# Patient Record
Sex: Female | Born: 1995 | Race: White | Hispanic: No | Smoking: Current every day smoker
Health system: Southern US, Community
[De-identification: ages and names within clinical notes are randomized; demographics above are authoritative.]

## PROBLEM LIST (undated history)

## (undated) DIAGNOSIS — M419 Scoliosis, unspecified: Secondary | ICD-10-CM

## (undated) DIAGNOSIS — O039 Complete or unspecified spontaneous abortion without complication: Secondary | ICD-10-CM

## (undated) HISTORY — PX: DILATION AND CURETTAGE OF UTERUS: SHX78

---

## 2019-06-06 ENCOUNTER — Other Ambulatory Visit: Payer: Self-pay

## 2019-06-06 ENCOUNTER — Emergency Department (HOSPITAL_COMMUNITY): Payer: No Typology Code available for payment source

## 2019-06-06 ENCOUNTER — Encounter (HOSPITAL_COMMUNITY): Payer: Self-pay | Admitting: Emergency Medicine

## 2019-06-06 ENCOUNTER — Emergency Department (HOSPITAL_COMMUNITY)
Admission: EM | Admit: 2019-06-06 | Discharge: 2019-06-06 | Disposition: A | Discharge status: Home / Self Care | Payer: No Typology Code available for payment source | Attending: Emergency Medicine | Admitting: Emergency Medicine

## 2019-06-06 DIAGNOSIS — S060X1A Concussion with loss of consciousness of 30 minutes or less, initial encounter: Secondary | ICD-10-CM

## 2019-06-06 DIAGNOSIS — S0990XA Unspecified injury of head, initial encounter: Secondary | ICD-10-CM | POA: Diagnosis present

## 2019-06-06 DIAGNOSIS — F1210 Cannabis abuse, uncomplicated: Secondary | ICD-10-CM | POA: Diagnosis not present

## 2019-06-06 DIAGNOSIS — Y998 Other external cause status: Secondary | ICD-10-CM | POA: Insufficient documentation

## 2019-06-06 DIAGNOSIS — R112 Nausea with vomiting, unspecified: Secondary | ICD-10-CM | POA: Diagnosis not present

## 2019-06-06 DIAGNOSIS — Y9389 Activity, other specified: Secondary | ICD-10-CM | POA: Insufficient documentation

## 2019-06-06 DIAGNOSIS — Y92410 Unspecified street and highway as the place of occurrence of the external cause: Secondary | ICD-10-CM | POA: Diagnosis not present

## 2019-06-06 HISTORY — DX: Scoliosis, unspecified: M41.9

## 2019-06-06 HISTORY — DX: Complete or unspecified spontaneous abortion without complication: O03.9

## 2019-06-06 LAB — RAPID URINE DRUG SCREEN, HOSP PERFORMED
Amphetamines: NOT DETECTED
Barbiturates: NOT DETECTED
Benzodiazepines: NOT DETECTED
Cocaine: NOT DETECTED
Opiates: NOT DETECTED
Tetrahydrocannabinol: POSITIVE — AB

## 2019-06-06 LAB — COMPREHENSIVE METABOLIC PANEL
ALT: 15 U/L (ref 0–44)
AST: 26 U/L (ref 15–41)
Albumin: 3.7 g/dL (ref 3.5–5.0)
Alkaline Phosphatase: 40 U/L (ref 38–126)
Anion gap: 10 (ref 5–15)
BUN: 8 mg/dL (ref 6–20)
CO2: 18 mmol/L — ABNORMAL LOW (ref 22–32)
Calcium: 8.5 mg/dL — ABNORMAL LOW (ref 8.9–10.3)
Chloride: 109 mmol/L (ref 98–111)
Creatinine, Ser: 0.71 mg/dL (ref 0.44–1.00)
GFR calc Af Amer: >60 mL/min (ref >60)
GFR calc non Af Amer: >60 mL/min (ref >60)
Glucose, Bld: 103 mg/dL — ABNORMAL HIGH (ref 70–99)
Potassium: 3.8 mmol/L (ref 3.5–5.1)
Sodium: 137 mmol/L (ref 135–145)
Total Bilirubin: 0.6 mg/dL (ref 0.3–1.2)
Total Protein: 5.9 g/dL — ABNORMAL LOW (ref 6.5–8.1)

## 2019-06-06 LAB — LACTIC ACID, PLASMA
Lactic Acid, Venous: 3.7 mmol/L (ref 0.5–1.9)
Lactic Acid, Venous: 1.5 mmol/L (ref 0.5–1.9)

## 2019-06-06 LAB — I-STAT CHEM 8, ED
BUN: 8 mg/dL (ref 6–20)
Calcium, Ion: 1.17 mmol/L (ref 1.15–1.40)
Chloride: 106 mmol/L (ref 98–111)
Creatinine, Ser: 0.5 mg/dL (ref 0.44–1.00)
Glucose, Bld: 99 mg/dL (ref 70–99)
HCT: 40 % (ref 36.0–46.0)
Hemoglobin: 13.6 g/dL (ref 12.0–15.0)
Potassium: 3.8 mmol/L (ref 3.5–5.1)
Sodium: 137 mmol/L (ref 135–145)
TCO2: 21 mmol/L — ABNORMAL LOW (ref 22–32)

## 2019-06-06 LAB — URINALYSIS, ROUTINE W REFLEX MICROSCOPIC
Bilirubin Urine: NEGATIVE
Glucose, UA: NEGATIVE mg/dL
Hgb urine dipstick: NEGATIVE
Ketones, ur: 5 mg/dL — AB
Leukocytes,Ua: NEGATIVE
Nitrite: NEGATIVE
Protein, ur: 30 mg/dL — AB
Specific Gravity, Urine: 1.017 (ref 1.005–1.030)
pH: 5 (ref 5.0–8.0)

## 2019-06-06 LAB — PROTIME-INR
INR: 1.2 (ref 0.8–1.2)
Prothrombin Time: 15.2 seconds (ref 11.4–15.2)

## 2019-06-06 LAB — CBC
HCT: 41 % (ref 36.0–46.0)
Hemoglobin: 13.5 g/dL (ref 12.0–15.0)
MCH: 31.1 pg (ref 26.0–34.0)
MCHC: 32.9 g/dL (ref 30.0–36.0)
MCV: 94.5 fL (ref 80.0–100.0)
Platelets: 278 10*3/uL (ref 150–400)
RBC: 4.34 MIL/uL (ref 3.87–5.11)
RDW: 13.1 % (ref 11.5–15.5)
WBC: 7.8 10*3/uL (ref 4.0–10.5)
nRBC: 0 % (ref 0.0–0.2)

## 2019-06-06 LAB — CDS SEROLOGY

## 2019-06-06 LAB — MAGNESIUM: Magnesium: 1.7 mg/dL (ref 1.7–2.4)

## 2019-06-06 LAB — SAMPLE TO BLOOD BANK

## 2019-06-06 LAB — I-STAT BETA HCG BLOOD, ED (MC, WL, AP ONLY): I-stat hCG, quantitative: <5 m[IU]/mL (ref <5)

## 2019-06-06 MED ORDER — SODIUM CHLORIDE 0.9 % IV BOLUS
1000.0000 mL | Freq: Once | INTRAVENOUS | Status: AC
  Administered 2019-06-06: 1000 mL via INTRAVENOUS

## 2019-06-06 MED ORDER — ONDANSETRON HCL 4 MG/2ML IJ SOLN
4.0000 mg | Freq: Once | INTRAMUSCULAR | Status: AC
  Administered 2019-06-06: 11:00:00 4 mg via INTRAVENOUS
  Filled 2019-06-06: qty 2

## 2019-06-06 MED ORDER — FENTANYL CITRATE (PF) 100 MCG/2ML IJ SOLN
50.0000 ug | Freq: Once | INTRAMUSCULAR | Status: AC
  Administered 2019-06-06: 50 ug via INTRAVENOUS
  Filled 2019-06-06: qty 2
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
## 2019-06-06 NOTE — ED Notes (Signed)
Pt given coke to drink for PO challenge
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
## 2019-06-06 NOTE — ED Notes (Signed)
Date and time results received: 06/06/19   Test:Lactic Acid Critical Value: 3.7 Name of Provider Notified: Tegler MD
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
## 2019-06-13 ENCOUNTER — Ambulatory Visit: Payer: Self-pay | Admitting: Neurology

## 2019-06-13 ENCOUNTER — Encounter: Payer: Self-pay | Admitting: Neurology

## 2019-06-13 ENCOUNTER — Other Ambulatory Visit: Payer: Self-pay

## 2019-06-13 VITALS — BP 108/62 | HR 104 | Temp 97.3°F | Ht 62.0 in | Wt 105.0 lb

## 2019-06-13 DIAGNOSIS — R4020 Unspecified coma: Secondary | ICD-10-CM

## 2019-06-13 DIAGNOSIS — R569 Unspecified convulsions: Secondary | ICD-10-CM

## 2019-06-13 DIAGNOSIS — Z8669 Personal history of other diseases of the nervous system and sense organs: Secondary | ICD-10-CM

## 2019-06-13 DIAGNOSIS — G43709 Chronic migraine without aura, not intractable, without status migrainosus: Secondary | ICD-10-CM

## 2019-06-13 MED ORDER — LEVETIRACETAM 500 MG PO TABS: 500.0000 mg | ORAL_TABLET | Freq: Two times a day (BID) | ORAL | 6 refills | Status: AC
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
## 2019-06-13 NOTE — Patient Instructions (Addendum)
Start Keppra  Folic Acid daily as discussed 1mg  a day We need an EEG in the office and then likely a 3-day EEG MRI of the brain w/wo contrast seizure protocol Email us/me when you get insurance and then we will schedule all the workup and a follow up. DO NOT HESITATE TO CALL if anything else happens.  Seizure, Adult A seizure is a sudden burst of abnormal electrical activity in the brain. Seizures usually last from 30 seconds to 2 minutes. The abnormal activity temporarily interrupts normal brain function. A seizure can cause many different symptoms depending on where in the brain it starts. What are the causes? Common causes of this condition include:  Fever or infection.  Brain abnormality, injury, bleeding, or tumor.  Low blood sugar.  Metabolic disorders or other conditions that are passed from parent to child (are inherited).  Reaction to a substance, such as a drug or a medicine, or suddenly stopping the use of a substance (withdrawal).  Stroke.  Developmental disorders such as autism or cerebral palsy. In some cases, the cause of this condition may not be known. Some people who have a seizure never have another one. Seizures usually do not cause brain damage or permanent problems unless they are prolonged. A person who has repeated seizures over time without a clear cause has a condition called epilepsy. What increases the risk? You are more likely to develop this condition if you have:  A family history of epilepsy.  Had a tonic-clonic seizure in the past. This is a type of seizure that involves whole-body contraction of muscles and a loss of consciousness.  Autism, cerebral palsy, or other brain disorders.  A history of head trauma, lack of oxygen at birth, or strokes. What are the signs or symptoms? There are many different types of seizures. The symptoms of a seizure vary depending on the type of seizure you have. Examples of symptoms during a seizure include:   Uncontrollable shaking (convulsions).  Stiffening of the body.  Loss of consciousness.  Head nodding.  Staring.  Not responding to sound or touch.  Loss of bladder or bowel control. Some people have symptoms right before a seizure happens (aura) and right after a seizure happens (postictal). Symptoms before a seizure may include:  Fear or anxiety.  Nausea.  Feeling like the room is spinning (vertigo).  A feeling of having seen or heard something before (dj vu).  Odd tastes or smells.  Changes in vision, such as seeing flashing lights or spots. Symptoms after a seizure may include:  Confusion.  Sleepiness.  Headache.  Weakness on one side of the body. How is this diagnosed? This condition may be diagnosed based on:  A description of your symptoms. Video of your seizures can be helpful.  Your medical history.  A physical exam. You may also have tests, including:  Blood tests.  CT scan.  MRI.  Electroencephalogram (EEG). This test measures electrical activity in the brain. An EEG can predict whether seizures will return (recur).  A spinal tap (also called a lumbar puncture). This is the removal and testing of fluid that surrounds the brain and spinal cord. How is this treated? Most seizures will stop on their own in under 5 minutes, and no treatment is needed. Seizures that last longer than 5 minutes will usually need treatment. Treatment can include:  Medicines given through an IV.  Avoiding known triggers, such as medicines that you take for another condition.  Medicines to treat epilepsy (antiepileptics),  if epilepsy caused your seizures.  Surgery to stop seizures, if you have epilepsy that does not respond to medicines. Follow these instructions at home: Medicines  Take over-the-counter and prescription medicines only as told by your health care provider.  Avoid any substances that may prevent your medicine from working properly, such as  alcohol. Activity  Do not drive, swim, or do any other activities that would be dangerous if you had another seizure. Wait until your health care provider says it is safe to do them.  If you live in the [place] check with your local DMV (department of motor vehicles) to find out about local driving laws. Each state has specific rules about when you can legally return to driving.  Get enough rest. Lack of sleep can make seizures more likely to occur. Educating others Teach friends and family what to do if you have a seizure. They should:  Lay you on the ground to prevent a fall.  Cushion your head and body.  Loosen any tight clothing around your neck.  Turn you on your side. If vomiting occurs, this helps keep your airway clear.  Not hold you down. Holding you down will not stop the seizure.  Not put anything into your mouth.  Know whether or not you need emergency care. For example, they should get help right away if you have a seizure that lasts longer than 5 minutes or have several seizures in a row.  Stay with you until you recover.  General instructions  Contact your health care provider each time you have a seizure.  Avoid anything that has ever triggered a seizure for you.  Keep a seizure diary. Record what you remember about each seizure, especially anything that might have triggered the seizure.  Keep all follow-up visits as told by your health care provider. This is important. Contact a health care provider if:  You have another seizure.  You have seizures more often.  Your seizure symptoms change.  You continue to have seizures with treatment.  You have symptoms of an infection or illness. This might increase your risk of having a seizure. Get help right away if:  You have a seizure that: ? Lasts longer than 5 minutes. ? Is different than previous seizures. ? Leaves you unable to speak or use a part of your body. ? Makes it harder to breathe.  You have:  ? A seizure after a head injury. ? Multiple seizures in a row. ? Confusion or a severe headache right after a seizure.  You do not wake up immediately after a seizure.  You injure yourself during a seizure. These symptoms may represent a serious problem that is an emergency. Do not wait to see if the symptoms will go away. Get medical help right away. Call your local emergency services (911 in the [place] Do not drive yourself to the hospital. Summary  Seizures are caused by abnormal electrical activity in the brain. The activity disrupts normal brain function and can cause various symptoms, such as convulsions, abnormal movements, or a change in consciousness.  There are many causes of seizures, including illnesses, medicines, genetic conditions, head injuries, strokes, tumors, substance abuse, or substance withdrawal.  Most seizures will stop on their own in under 5 minutes. Seizures that last longer than 5 minutes are a medical emergency and require immediate treatment.  Many medicines are used to treat seizures. Take over-the-counter and prescription medicines only as told by your health care provider. This information is not  intended to replace advice given to you by your health care provider. Make sure you discuss any questions you have with your health care provider. Document Revised: 07/06/2018 Document Reviewed: 07/06/2018 Elsevier Patient Education  2020 [employer]  Levetiracetam tablets What is this medicine? LEVETIRACETAM (lee ve tye RA se tam) is an antiepileptic drug. It is used with other medicines to treat certain types of seizures. This medicine may be used for other purposes; ask your health care provider or pharmacist if you have questions. COMMON BRAND NAME(S): Keppra, Roweepra What should I tell my health care provider before I take this medicine? They need to know if you have any of these conditions:  kidney disease  suicidal thoughts, plans, or attempt; a  previous suicide attempt by you or a family member  an unusual or allergic reaction to levetiracetam, other medicines, foods, dyes, or preservatives  pregnant or trying to get pregnant  breast-feeding How should I use this medicine? Take this medicine by mouth with a glass of water. Follow the directions on the prescription label. Swallow the tablets whole. Do not crush or chew this medicine. You may take this medicine with or without food. Take your doses at regular intervals. Do not take your medicine more often than directed. Do not stop taking this medicine or any of your seizure medicines unless instructed by your doctor or health care professional. Stopping your medicine suddenly can increase your seizures or their severity. A special MedGuide will be given to you by the pharmacist with each prescription and refill. Be sure to read this information carefully each time. Contact your pediatrician or health care professional regarding the use of this medication in children. While this drug may be prescribed for children as young as 3 years of age for selected conditions, precautions do apply. Overdosage: If you think you have taken too much of this medicine contact a poison control center or emergency room at once. NOTE: This medicine is only for you. Do not share this medicine with others. What if I miss a dose? If you miss a dose, take it as soon as you can. If it is almost time for your next dose, take only that dose. Do not take double or extra doses. What may interact with this medicine? This medicine may interact with the following medications:  carbamazepine  colesevelam  probenecid  sevelamer This list may not describe all possible interactions. Give your health care provider a list of all the medicines, herbs, non-prescription drugs, or dietary supplements you use. Also tell them if you smoke, drink alcohol, or use illegal drugs. Some items may interact with your medicine. What  should I watch for while using this medicine? Visit your doctor or health care provider for a regular check on your progress. Wear a medical identification bracelet or chain to say you have epilepsy, and carry a card that lists all your medications. This medicine may cause serious skin reactions. They can happen weeks to months after starting the medicine. Contact your health care provider right away if you notice fevers or flu-like symptoms with a rash. The rash may be red or purple and then turn into blisters or peeling of the skin. Or, you might notice a red rash with swelling of the face, lips or lymph nodes in your neck or under your arms. It is important to take this medicine exactly as instructed by your health care provider. When first starting treatment, your dose may need to be adjusted. It may take  weeks or months before your dose is stable. You should contact your doctor or health care provider if your seizures get worse or if you have any new types of seizures. You may get drowsy or dizzy. Do not drive, use machinery, or do anything that needs mental alertness until you know how this medicine affects you. Do not stand or sit up quickly, especially if you are an older patient. This reduces the risk of dizzy or fainting spells. Alcohol may interfere with the effect of this medicine. Avoid alcoholic drinks. The use of this medicine may increase the chance of suicidal thoughts or actions. Pay special attention to how you are responding while on this medicine. Any worsening of mood, or thoughts of suicide or dying should be reported to your health care provider right away. Women who become pregnant while using this medicine may enroll in the [place] [employer] Antiepileptic Drug Pregnancy Registry by calling [phone]. This registry collects information about the safety of antiepileptic drug use during pregnancy. What side effects may I notice from receiving this medicine? Side effects that you  should report to your doctor or health care professional as soon as possible:  allergic reactions like skin rash, itching or hives, swelling of the face, lips, or tongue  breathing problems  dark urine  general ill feeling or flu-like symptoms  problems with balance, talking, walking  rash, fever, and swollen lymph nodes  redness, blistering, peeling or loosening of the skin, including inside the mouth  unusually weak or tired  worsening of mood, thoughts or actions of suicide or dying  yellowing of the eyes or skin Side effects that usually do not require medical attention (report to your doctor or health care professional if they continue or are bothersome):  diarrhea  dizzy, drowsy  headache  loss of appetite This list may not describe all possible side effects. Call your doctor for medical advice about side effects. You may report side effects to FDA at 1-800-FDA-1088. Where should I keep my medicine? Keep out of reach of children. Store at room temperature between 15 and 30 degrees C (59 and 86 degrees F). Throw away any unused medicine after the expiration date. NOTE: This sheet is a summary. It may not cover all possible information. If you have questions about this medicine, talk to your doctor, pharmacist, or health care provider.  2020 Elsevier/[employer] Standard (2018-07-20 15:23:36)

## 2019-06-17 ENCOUNTER — Encounter: Payer: Self-pay | Admitting: Neurology

## 2019-06-17 DIAGNOSIS — Z8669 Personal history of other diseases of the nervous system and sense organs: Secondary | ICD-10-CM | POA: Insufficient documentation

## 2019-06-17 DIAGNOSIS — R4020 Unspecified coma: Secondary | ICD-10-CM | POA: Insufficient documentation

## 2019-06-24 ENCOUNTER — Telehealth: Payer: Self-pay | Admitting: Neurology

## 2019-06-24 NOTE — Telephone Encounter (Signed)
I attempted to contact patient to schedule EEG per Dr. Lucia Gaskins. Patient did not answer and did not have VM set up.

## 2019-06-27 ENCOUNTER — Telehealth: Payer: Self-pay | Admitting: Neurology

## 2019-06-27 NOTE — Telephone Encounter (Signed)
I spoke to the patient she states she is in the process of getting insurance and will give me a call when she has that information to get the MRI schedule.
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
## 2019-09-11 ENCOUNTER — Encounter: Payer: Self-pay | Admitting: Neurology

## 2019-09-11 ENCOUNTER — Ambulatory Visit: Payer: Self-pay | Admitting: Neurology

## 2021-07-12 IMAGING — CT CT CERVICAL SPINE W/O CM
3 series · 15 of 27 positions shown, 18 images · non-contrast
Comparison: None.

CLINICAL DATA: , head trauma.

EXAM:
CT HEAD WITHOUT CONTRAST
CT CERVICAL SPINE WITHOUT CONTRAST
TECHNIQUE: Multidetector CT imaging of the head and cervical spine was
performed following the standard protocol without intravenous
contrast. Multiplanar CT image reconstructions of the cervical spine
were also generated.

[Series 5: c spine soft · axial · 0.37mm/px · z∈[+929,+1041]mm · 5 of 84 slices shown]
[im 14/84  soft-tissue]
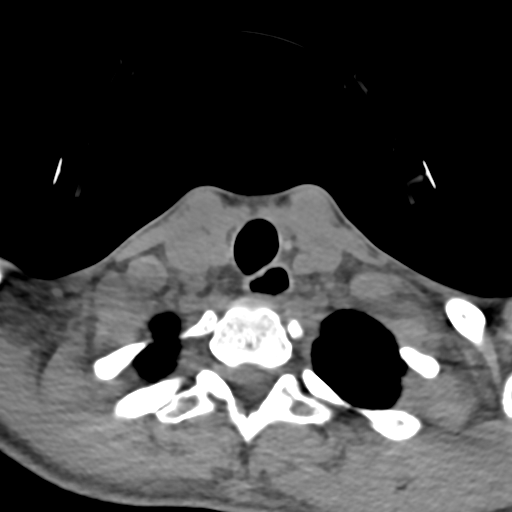
[im 28/84  soft-tissue]
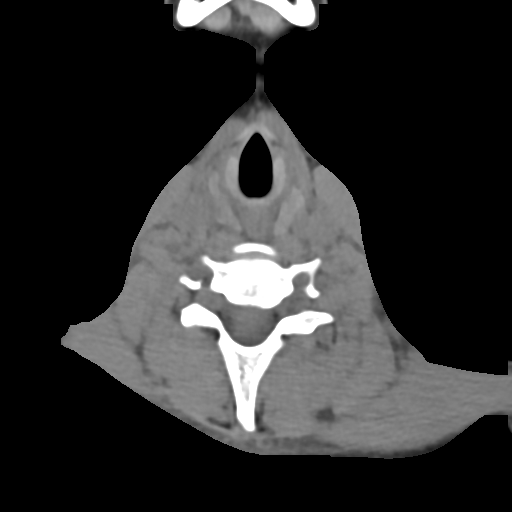
[im 42/84  soft-tissue]
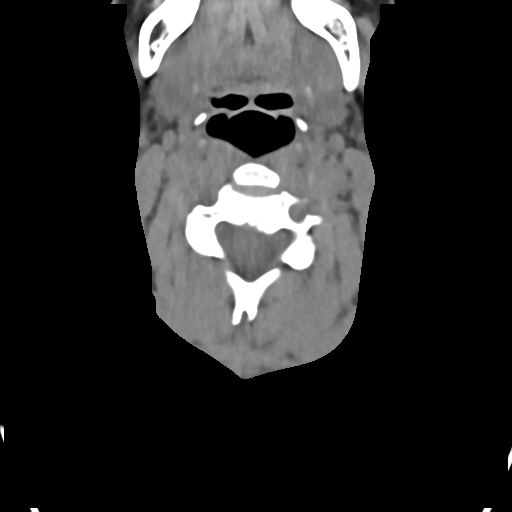
[im 56/84  soft-tissue]
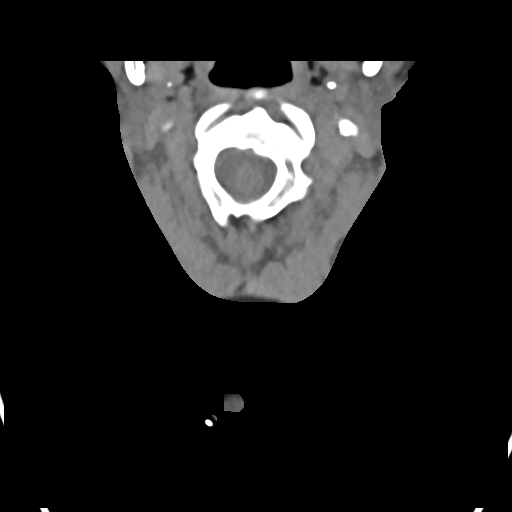
[im 70/84  soft-tissue]
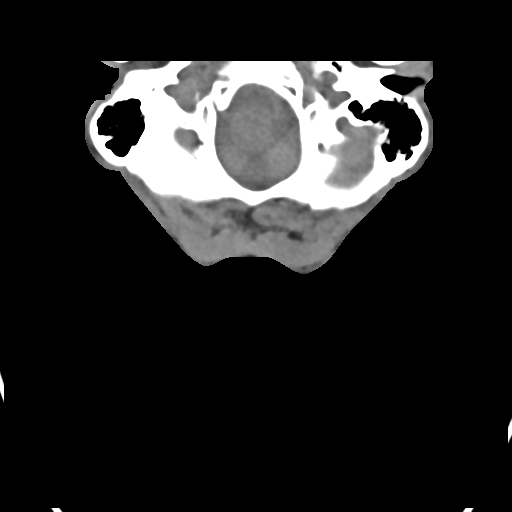

[Series 8: sag bone · sagittal · 0.32mm/px · 5 of 52 slices shown, 6 images]
[im 18/52  bone]
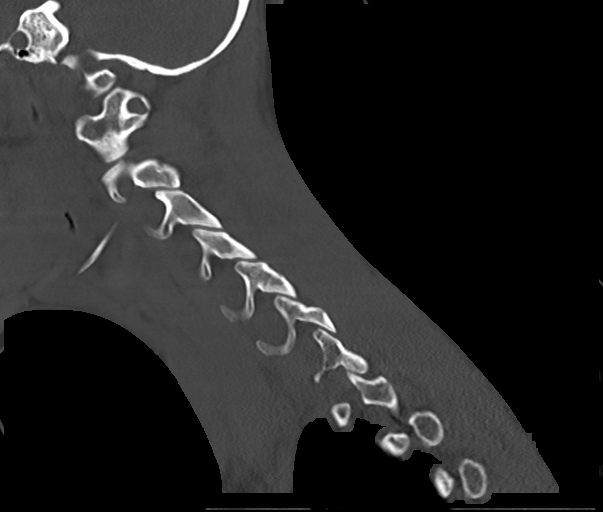
[im 22/52  bone]
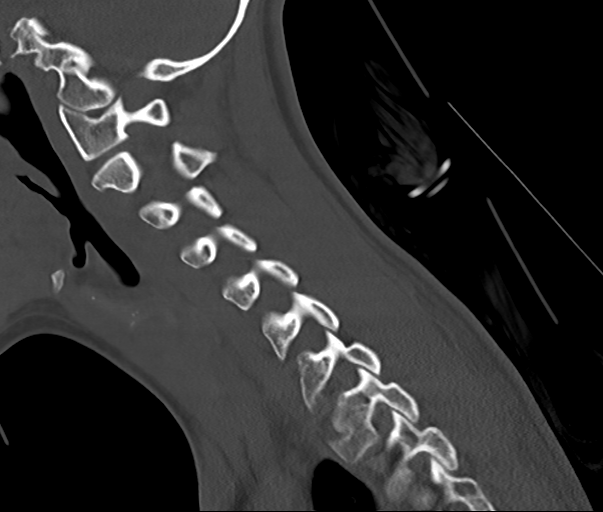
[im 26/52  soft-tissue]
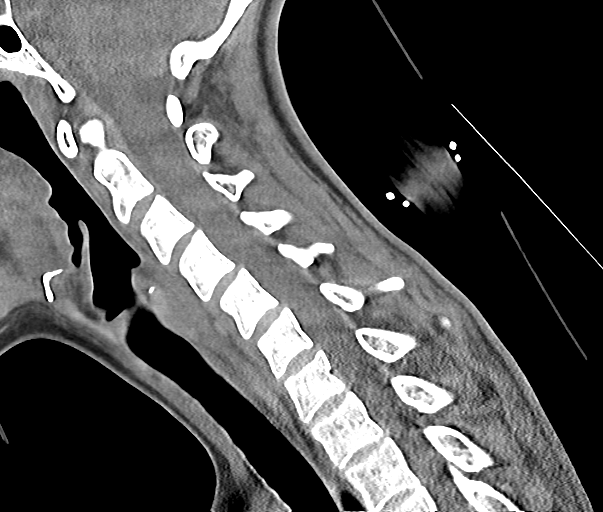
[im 26/52  bone]
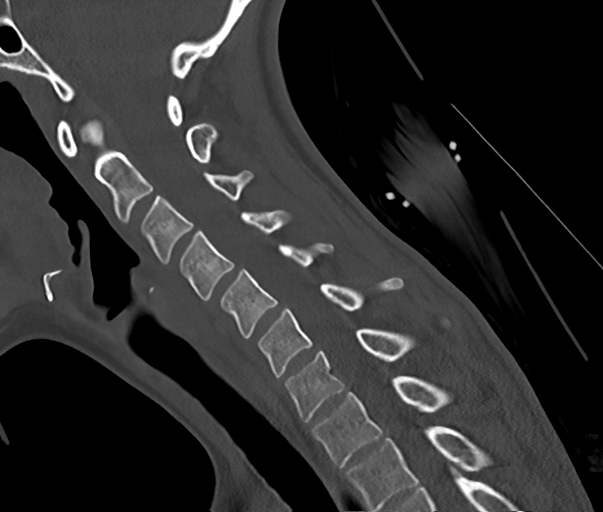
[im 30/52  bone]
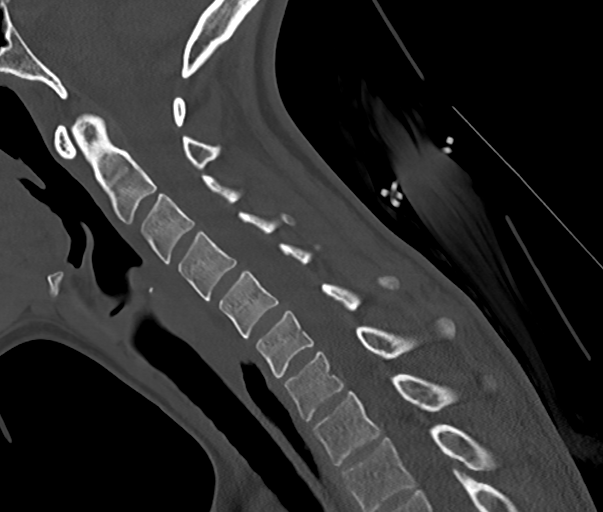
[im 35/52  bone]
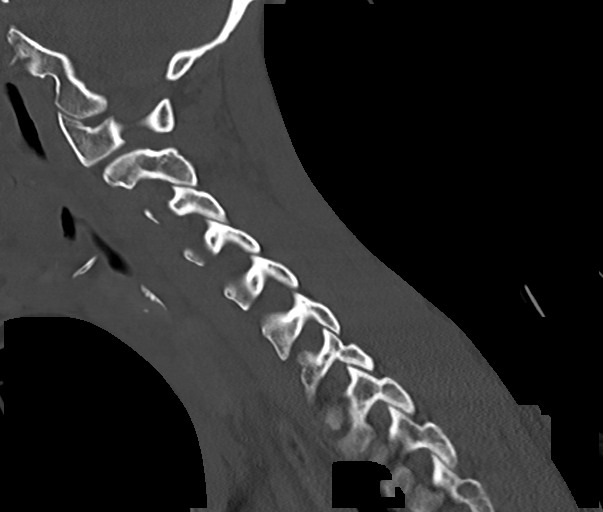

[Series 10: orthogonal axials · axial · 0.21mm/px · z∈[+912,+1002]mm · 5 of 89 slices shown, 7 images]
[im 15/89  soft-tissue]
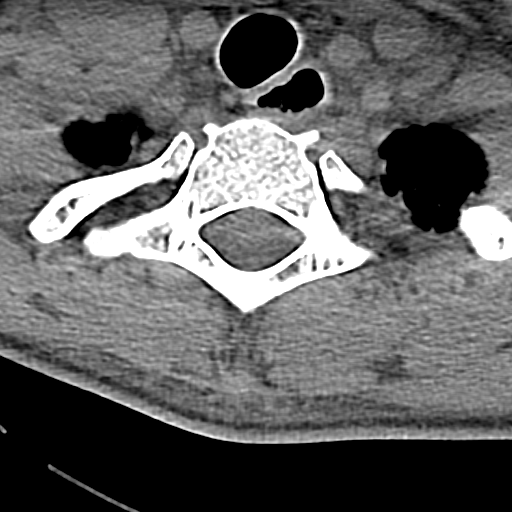
[im 15/89  bone]
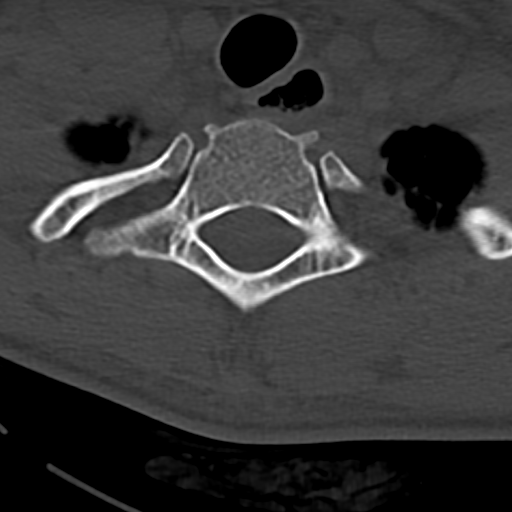
[im 30/89  bone]
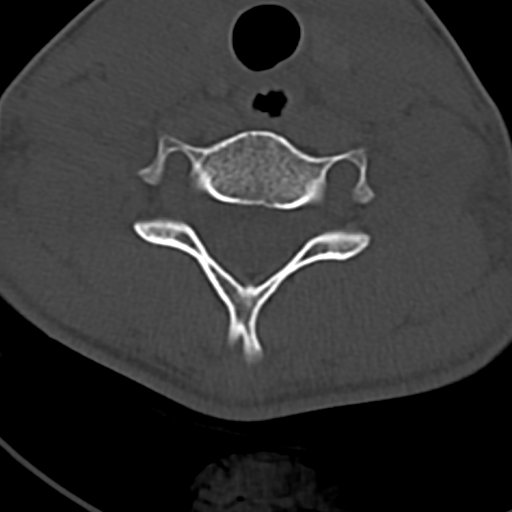
[im 45/89  bone]
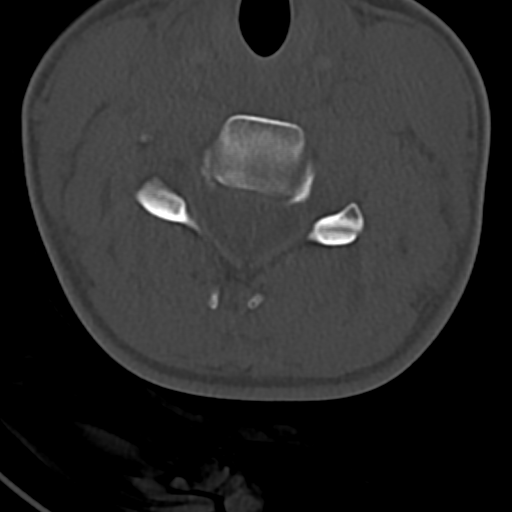
[im 59/89  bone]
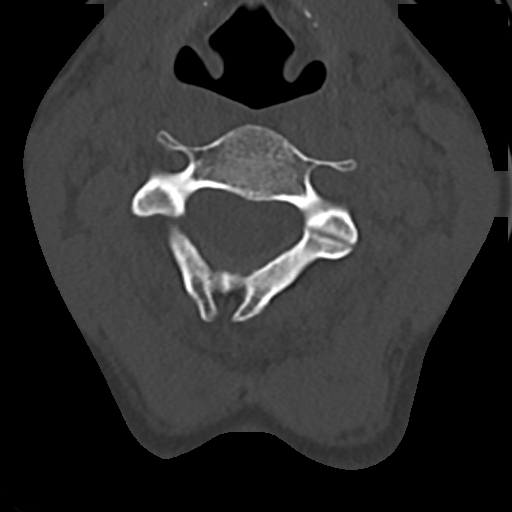
[im 74/89  soft-tissue]
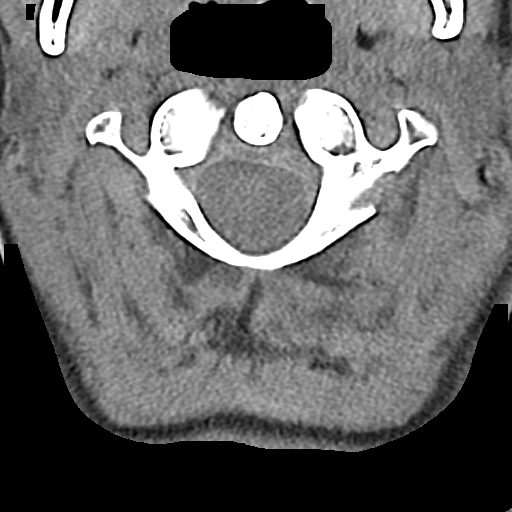
[im 74/89  bone]
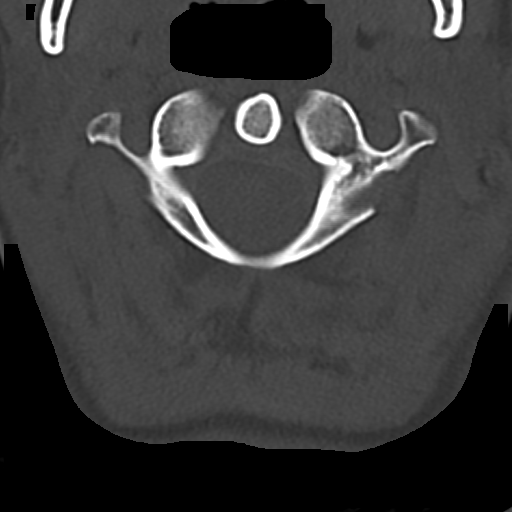

[15 of 27 positions shown; findings below may reference images not displayed]

FINDINGS: CT HEAD FINDINGS

Brain: No evidence of acute infarction, hemorrhage, hydrocephalus,
extra-axial collection or mass lesion/mass effect.

Vascular: No hyperdense vessel or unexpected calcification.

Skull: Normal. Negative for fracture or focal lesion.

Sinuses/Orbits: No acute finding.

Other: Right frontal/superior orbital hematoma in the scalp.

CT CERVICAL SPINE FINDINGS

Alignment: Mild straightening of normal cervical lordosis, likely
positional.

Skull base and vertebrae: No acute fracture. No primary bone lesion
or focal pathologic process.

Soft tissues and spinal canal: No prevertebral fluid or swelling. No
visible canal hematoma.

Disc levels:  No significant degenerative change.

Upper chest: Negative.

Other: None
IMPRESSION: CT head:

1. Right frontal/superior orbital scalp hematoma. No underlying
skull fracture.
2. No acute intracranial pathology.

CT cervical spine:

1. No fracture or malalignment of the cervical spine.
